# Patient Record
Sex: Male | Born: 1995
Health system: Southern US, Community
[De-identification: ages and names within clinical notes are randomized; demographics above are authoritative.]

---
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
## 2019-01-19 ENCOUNTER — Other Ambulatory Visit: Payer: Self-pay | Admitting: Emergency Medicine

## 2019-01-19 DIAGNOSIS — Z20822 Contact with and (suspected) exposure to covid-19: Secondary | ICD-10-CM

## 2019-01-20 ENCOUNTER — Other Ambulatory Visit: Payer: Self-pay

## 2019-01-20 DIAGNOSIS — Z20822 Contact with and (suspected) exposure to covid-19: Secondary | ICD-10-CM

## 2019-01-20 LAB — NOVEL CORONAVIRUS, NAA: SARS-CoV-2, NAA: DETECTED — AB

## 2019-01-22 LAB — NOVEL CORONAVIRUS, NAA: SARS-CoV-2, NAA: DETECTED — AB

## 2020-01-03 ENCOUNTER — Ambulatory Visit: Payer: Self-pay | Attending: Internal Medicine

## 2020-01-03 DIAGNOSIS — Z23 Encounter for immunization: Secondary | ICD-10-CM

## 2020-01-03 NOTE — Progress Notes (Signed)
   Covid-19 Vaccination Clinic  Name:  Clifford Scott    MRN: [id number] DOB: 1996/02/18  01/03/2020  Clifford Scott was observed post Covid-19 immunization for 15 minutes without incident. He was provided with Vaccine Information Sheet and instruction to access the V-Safe system.   Clifford Scott was instructed to call 911 with any severe reactions post vaccine: Marland Kitchen Difficulty breathing  . Swelling of face and throat  . A fast heartbeat  . A bad rash all over body  . Dizziness and weakness   Immunizations Administered    Name Date Dose VIS Date Route   Pfizer COVID-19 Vaccine 01/03/2020 12:29 PM 0.3 mL 06/29/2018 Intramuscular   Manufacturer: [employer], [employer]   Lot: [id number]   NDC: [address]-8

## 2020-01-24 ENCOUNTER — Ambulatory Visit: Payer: Self-pay

## 2020-01-31 ENCOUNTER — Ambulatory Visit: Payer: Self-pay

## 2020-01-31 ENCOUNTER — Ambulatory Visit: Payer: Self-pay | Attending: Internal Medicine

## 2020-01-31 DIAGNOSIS — Z23 Encounter for immunization: Secondary | ICD-10-CM

## 2020-01-31 NOTE — Progress Notes (Signed)
   Covid-19 Vaccination Clinic  Name:  Clifford Scott    MRN: [id number] DOB: 1995/06/23  01/31/2020  Clifford Scott was observed post Covid-19 immunization for 15 minutes without incident. He was provided with Vaccine Information Sheet and instruction to access the V-Safe system.   Clifford Scott was instructed to call 911 with any severe reactions post vaccine: Marland Kitchen Difficulty breathing  . Swelling of face and throat  . A fast heartbeat  . A bad rash all over body  . Dizziness and weakness   Immunizations Administered    Name Date Dose VIS Date Route   Pfizer COVID-19 Vaccine 01/31/2020 12:24 PM 0.3 mL 06/29/2018 Intramuscular   Manufacturer: [employer], [employer]   Lot: [id number]   NDC: [address]-0
